# Patient Record
Sex: Male | Born: 1990 | Race: Black or African American | Hispanic: No | Marital: Single | Smoking: Never smoker
Health system: Southern US, Community
[De-identification: ages and names within clinical notes are randomized; demographics above are authoritative.]

## PROBLEM LIST (undated history)

## (undated) DIAGNOSIS — A6000 Herpesviral infection of urogenital system, unspecified: Secondary | ICD-10-CM

---

## 2017-05-29 ENCOUNTER — Encounter (HOSPITAL_COMMUNITY): Payer: Self-pay | Admitting: *Deleted

## 2017-05-29 ENCOUNTER — Ambulatory Visit (HOSPITAL_COMMUNITY)
Admission: EM | Admit: 2017-05-29 | Discharge: 2017-05-29 | Disposition: A | Discharge status: Home / Self Care | Payer: 59 | Attending: Internal Medicine | Admitting: Internal Medicine

## 2017-05-29 DIAGNOSIS — B078 Other viral warts: Secondary | ICD-10-CM | POA: Diagnosis not present

## 2017-05-29 DIAGNOSIS — R197 Diarrhea, unspecified: Secondary | ICD-10-CM | POA: Insufficient documentation

## 2017-05-29 DIAGNOSIS — R110 Nausea: Secondary | ICD-10-CM | POA: Insufficient documentation

## 2017-05-29 DIAGNOSIS — R1030 Lower abdominal pain, unspecified: Secondary | ICD-10-CM | POA: Diagnosis not present

## 2017-05-29 DIAGNOSIS — Z87438 Personal history of other diseases of male genital organs: Secondary | ICD-10-CM

## 2017-05-29 LAB — CBC WITH DIFFERENTIAL/PLATELET
Basophils Absolute: 0 10*3/uL (ref 0.0–0.1)
Basophils Relative: 0 %
Eosinophils Absolute: 0.1 10*3/uL (ref 0.0–0.7)
Eosinophils Relative: 2 %
HCT: 43.6 % (ref 39.0–52.0)
Hemoglobin: 14.9 g/dL (ref 13.0–17.0)
Lymphocytes Relative: 37 %
Lymphs Abs: 1.3 10*3/uL (ref 0.7–4.0)
MCH: 30.9 pg (ref 26.0–34.0)
MCHC: 34.2 g/dL (ref 30.0–36.0)
MCV: 90.5 fL (ref 78.0–100.0)
Monocytes Absolute: 0.2 10*3/uL (ref 0.1–1.0)
Monocytes Relative: 7 %
Neutro Abs: 1.8 10*3/uL (ref 1.7–7.7)
Neutrophils Relative %: 54 %
Platelets: 186 10*3/uL (ref 150–400)
RBC: 4.82 MIL/uL (ref 4.22–5.81)
RDW: 13.3 % (ref 11.5–15.5)
WBC: 3.4 10*3/uL — ABNORMAL LOW (ref 4.0–10.5)

## 2017-05-29 LAB — POCT I-STAT, CHEM 8
BUN: 12 mg/dL (ref 6–20)
CALCIUM ION: 1.11 mmol/L — AB (ref 1.15–1.40)
CHLORIDE: 102 mmol/L (ref 101–111)
Creatinine, Ser: 1.2 mg/dL (ref 0.61–1.24)
GLUCOSE: 109 mg/dL — AB (ref 65–99)
HCT: 47 % (ref 39.0–52.0)
Hemoglobin: 16 g/dL (ref 13.0–17.0)
POTASSIUM: 3.4 mmol/L — AB (ref 3.5–5.1)
Sodium: 141 mmol/L (ref 135–145)
TCO2: 25 mmol/L (ref 0–100)

## 2017-05-29 MED ORDER — VALACYCLOVIR HCL 1 G PO TABS: 1000.0000 mg | ORAL_TABLET | Freq: Three times a day (TID) | ORAL | 0 refills | Status: DC

## 2017-05-29 MED ORDER — SALICYLIC ACID 6 % EX GEL: Freq: Every day | CUTANEOUS | 0 refills | Status: DC

## 2017-05-29 MED ORDER — ONDANSETRON HCL 4 MG PO TABS: 4.0000 mg | ORAL_TABLET | Freq: Four times a day (QID) | ORAL | 0 refills | Status: DC

## 2017-05-29 NOTE — Discharge Instructions (Signed)
°  No Primary Care Doctor: °Call [facility] at  [phone] - they can help you locate a primary care doctor that  accepts your insurance, provides certain services, etc. °Physician Referral Service- [phone] ° ° ° °

## 2017-05-29 NOTE — ED Triage Notes (Signed)
Pt  Reports     Low   abd  Pain    Diarrhea      And  Cramps    With  Onset  Of   Symptoms  X  1   Week     Pt  Also  Has  Sore    On     Groin   Area   For  3  Days  That  Burns  And  Is  painfull

## 2017-05-29 NOTE — ED Provider Notes (Signed)
CSN: [id number]     Arrival date & time 05/29/17  1349 History   First MD Initiated Contact with Patient 05/29/17 1416     Chief Complaint  Patient presents with  . Abdominal Pain   (Consider location/radiation/quality/duration/timing/severity/associated sxs/prior Treatment) HPI  Jacob Ingram is a 26 y.o. male presenting to UC with c/o lower abdominal cramping with 1 week of intermittent watery diarrhea and loose stools.  No blood or mucous in stool.  He has also noticed a sore on the of proximal shaft of penis. Sore is itching and burning.  It has been there about 3 days.  It has drained a minimal amount. He would like to be tested for STDs including HIV and syphilis.   He also reports having a boil or bump on the top of his Right foot for about 1-2 weeks. He notes he has had similar lesions that appear at various spots on his body since he was a young boy. He denies pain on that lesion. He does not have a PCP at this time but would like a referral.    History reviewed. No pertinent past medical history. History reviewed. No pertinent surgical history. History reviewed. No pertinent family history. Social History  Substance Use Topics  . Smoking status: Never Smoker  . Smokeless tobacco: Never Used  . Alcohol use Yes    Review of Systems  Constitutional: Negative for chills and fever.  Gastrointestinal: Positive for abdominal pain (diffuse cramping), diarrhea and nausea. Negative for blood in stool and vomiting.  Genitourinary: Positive for genital sores. Negative for discharge, dysuria, frequency, penile pain, penile swelling, scrotal swelling and testicular pain.  Musculoskeletal: Negative for back pain and myalgias.  Skin: Positive for color change and wound.    Allergies  Patient has no known allergies.  Home Medications   Prior to Admission medications   Medication Sig Start Date End Date Taking? Authorizing Provider  ondansetron (ZOFRAN) 4 MG tablet Take 1 tablet (4 mg  total) by mouth every 6 (six) hours. 05/29/17   Lurene ShadowPhelps, Burnard Enis O, PA-C  salicylic acid 6 % gel Apply topically daily. To wart on Right foot for up to 12 weeks 05/29/17   Lurene ShadowPhelps, Judea Fennimore O, PA-C  valACYclovir (VALTREX) 1000 MG tablet Take 1 tablet (1,000 mg total) by mouth 3 (three) times daily. 05/29/17   Lurene ShadowPhelps, Kayla Weekes O, PA-C   Meds Ordered and Administered this Visit  Medications - No data to display  BP 116/72 (BP Location: Right Arm)   Pulse 66   Temp 98.7 F (37.1 C) (Oral)   Resp 18   SpO2 100%  No data found.   Physical Exam  Constitutional: He is oriented to person, place, and time. He appears well-developed and well-nourished. No distress.  HENT:  Head: Normocephalic and atraumatic.  Mouth/Throat: Oropharynx is clear and moist.  Eyes: EOM are normal.  Neck: Normal range of motion. Neck supple.  Cardiovascular: Normal rate and regular rhythm.   Pulmonary/Chest: Effort normal and breath sounds normal. No respiratory distress. He has no wheezes. He has no rales.  Abdominal: Soft. Bowel sounds are normal. He exhibits no distension and no mass. There is no tenderness. There is no rebound and no guarding. No hernia.  Genitourinary: Penis normal.     Genitourinary Comments: Two small scabbed over lesions at top of penis. Tender. No active drainage.   Musculoskeletal: Normal range of motion. He exhibits no edema or tenderness.  Neurological: He is alert and oriented to person, place, and  time.  Skin: Skin is warm and dry. He is not diaphoretic.  Right foot: 1cm circular flesh colored non-tender lesion on dorsal aspect.   Psychiatric: He has a normal mood and affect. His behavior is normal.  Nursing note and vitals reviewed.   Urgent Care Course     Procedures (including critical care time)  Labs Review Labs Reviewed  POCT I-STAT, CHEM 8 - Abnormal; Notable for the following:       Result Value   Potassium 3.4 (*)    Glucose, Bld 109 (*)    Calcium, Ion 1.11 (*)    All other  components within normal limits  HSV CULTURE AND TYPING  CBC WITH DIFFERENTIAL/PLATELET  RPR  HIV ANTIBODY (ROUTINE TESTING)  HSV 1 ANTIBODY, IGG  HSV 2 ANTIBODY, IGG  URINE CYTOLOGY ANCILLARY ONLY    Imaging Review No results found.    MDM   1. Nausea without vomiting   2. Diarrhea, unspecified type   3. History of penile sores   4. Other viral warts    Blood work for HIV, syphilis, and HSV I&2 sent to lab Urine for GC/chlamydia sent to lab Swab of penile lesion for HSV sent to lab Will start empiric treatment for genital herpes  Lesion on Right foot c/w wart. Rx: zofran, valtrex, and salicyclic acid gel for foot.  Home instructions provided F/u with PCP as needed.    Lurene Shadow, [place] 05/29/17 [phone]

## 2017-05-30 LAB — RPR: RPR Ser Ql: NONREACTIVE

## 2017-05-30 LAB — HIV ANTIBODY (ROUTINE TESTING W REFLEX): HIV Screen 4th Generation wRfx: NONREACTIVE

## 2017-05-31 LAB — HSV CULTURE AND TYPING

## 2017-05-31 LAB — URINE CYTOLOGY ANCILLARY ONLY
Chlamydia: NEGATIVE
Neisseria Gonorrhea: NEGATIVE

## 2019-06-04 ENCOUNTER — Ambulatory Visit (HOSPITAL_COMMUNITY)
Admission: EM | Admit: 2019-06-04 | Discharge: 2019-06-04 | Disposition: A | Discharge status: Home / Self Care | Payer: Commercial Managed Care - PPO | Attending: Family Medicine | Admitting: Family Medicine

## 2019-06-04 ENCOUNTER — Encounter (HOSPITAL_COMMUNITY): Payer: Self-pay | Admitting: Emergency Medicine

## 2019-06-04 DIAGNOSIS — Z113 Encounter for screening for infections with a predominantly sexual mode of transmission: Secondary | ICD-10-CM | POA: Diagnosis present

## 2019-06-04 DIAGNOSIS — R202 Paresthesia of skin: Secondary | ICD-10-CM | POA: Diagnosis not present

## 2019-06-04 DIAGNOSIS — Z8619 Personal history of other infectious and parasitic diseases: Secondary | ICD-10-CM

## 2019-06-04 DIAGNOSIS — R300 Dysuria: Secondary | ICD-10-CM | POA: Diagnosis not present

## 2019-06-04 MED ORDER — VALACYCLOVIR HCL 500 MG PO TABS: 500.0000 mg | ORAL_TABLET | Freq: Two times a day (BID) | ORAL | 0 refills | Status: AC

## 2019-06-04 NOTE — ED Triage Notes (Signed)
Pt states a year ago he was dx with herpes and sometimes he has off and on outbreaks. Pt staets sometimes he feels a tingle "down there". Requesting testing for stds.

## 2019-06-04 NOTE — Discharge Instructions (Signed)
Treating you for herpes flare. Sending your urine for further testing We will call you with any positive results.Follow up as needed for continued or worsening symptoms

## 2019-06-05 NOTE — ED Provider Notes (Signed)
[facility]    CSN: [id number] Arrival date & time: 06/04/19  1148      History   Chief Complaint Chief Complaint  Patient presents with  . Appointment    1210  . SEXUALLY TRANSMITTED DISEASE    HPI Karen KaysDamonte Kem is a 28 y.o. male.   Pt is a 28 year old male that presents today for STD screening. PMH of herpes. Feels a tingling sensation in the genital area. No active rash.  Mild dysuria.  No discharge.  Symptoms have been constant over the past couple days.  He is currently sexually active with one partner.  Reports uses protection.  He is concerned about herpes outbreak and would like treatment.  And he would also like to be tested for STDs.  Denies any abdominal pain, back pain, fevers, testicle pain or testicle swelling.  ROS per HPI      History reviewed. No pertinent past medical history.  There are no active problems to display for this patient.   History reviewed. No pertinent surgical history.     Home Medications    Prior to Admission medications   Medication Sig Start Date End Date Taking? Authorizing Provider  valACYclovir (VALTREX) 500 MG tablet Take 1 tablet (500 mg total) by mouth 2 (two) times daily for 3 days. 06/04/19 06/07/19  Janace ArisBast, Dalyn Kjos A, NP    Family History No family history on file.  Social History Social History   Tobacco Use  . Smoking status: Never Smoker  . Smokeless tobacco: Never Used  Substance Use Topics  . Alcohol use: Yes  . Drug use: Not on file     Allergies   Patient has no known allergies.   Review of Systems Review of Systems   Physical Exam Triage Vital Signs ED Triage Vitals [06/04/19 1208]  Enc Vitals Group     BP (!) 104/57     Pulse Rate 64     Resp 16     Temp 97.9 F (36.6 C)     Temp src      SpO2 100 %     Weight      Height      Head Circumference      Peak Flow      Pain Score 0     Pain Loc      Pain Edu?      Excl. in GC?    No data found.  Updated Vital Signs BP  (!) 104/57   Pulse 64   Temp 97.9 F (36.6 C)   Resp 16   SpO2 100%   Visual Acuity Right Eye Distance:   Left Eye Distance:   Bilateral Distance:    Right Eye Near:   Left Eye Near:    Bilateral Near:     Physical Exam Vitals signs and nursing note reviewed.  Constitutional:      Appearance: Normal appearance.  HENT:     Head: Normocephalic and atraumatic.     Nose: Nose normal.  Eyes:     Conjunctiva/sclera: Conjunctivae normal.  Neck:     Musculoskeletal: Normal range of motion.  Pulmonary:     Effort: Pulmonary effort is normal.  Abdominal:     Palpations: Abdomen is soft.     Tenderness: There is no abdominal tenderness.  Musculoskeletal: Normal range of motion.  Skin:    General: Skin is warm and dry.  Neurological:     Mental Status: He is alert.  Psychiatric:  Mood and Affect: Mood normal.      UC Treatments / Results  Labs (all labs ordered are listed, but only abnormal results are displayed) Labs Reviewed  [place]    EKG   Radiology No results found.  Procedures Procedures (including critical care time)  Medications Ordered in UC Medications - No data to display  Initial Impression / Assessment and Plan / UC Course  I have reviewed the triage vital signs and the nursing notes.  Pertinent labs & imaging results that were available during my care of the patient were reviewed by me and considered in my medical decision making (see chart for details).     Screening for STDs Treating for herpes flare Labs pending. Final Clinical Impressions(s) / UC Diagnoses   Final diagnoses:  Screen for STD (sexually transmitted disease)  Dysuria     Discharge Instructions     Treating you for herpes flare. Sending your urine for further testing We will call you with any positive results.Follow up as needed for continued or worsening symptoms    ED Prescriptions    Medication Sig Dispense Auth. Provider    valACYclovir (VALTREX) 500 MG tablet Take 1 tablet (500 mg total) by mouth 2 (two) times daily for 3 days. 6 tablet Loura Halt A, NP     Controlled Substance Prescriptions [place] Controlled Substance Registry consulted? Not Applicable   Orvan July, NP 06/05/19 [phone]

## 2019-06-06 LAB — URINE CYTOLOGY ANCILLARY ONLY
Chlamydia: NEGATIVE
Neisseria Gonorrhea: NEGATIVE
Trichomonas: NEGATIVE

## 2019-07-10 ENCOUNTER — Other Ambulatory Visit: Payer: Self-pay

## 2019-07-10 ENCOUNTER — Encounter (HOSPITAL_COMMUNITY): Payer: Self-pay | Admitting: Emergency Medicine

## 2019-07-10 ENCOUNTER — Ambulatory Visit (HOSPITAL_COMMUNITY)
Admission: EM | Admit: 2019-07-10 | Discharge: 2019-07-10 | Disposition: A | Discharge status: Home / Self Care | Payer: Commercial Managed Care - PPO | Attending: Family Medicine | Admitting: Family Medicine

## 2019-07-10 DIAGNOSIS — A6000 Herpesviral infection of urogenital system, unspecified: Secondary | ICD-10-CM

## 2019-07-10 HISTORY — DX: Herpesviral infection of urogenital system, unspecified: A60.00

## 2019-07-10 MED ORDER — VALACYCLOVIR HCL 1 G PO TABS: 1000.0000 mg | ORAL_TABLET | Freq: Two times a day (BID) | ORAL | 1 refills | Status: AC

## 2019-07-10 NOTE — Discharge Instructions (Signed)
At the first sign of an outbreak take 1 pill 2 times a day for 3 days I have given you 1 refill Recommend you follow-up with your primary care doctor for ongoing refills

## 2019-07-10 NOTE — ED Provider Notes (Signed)
[facility]    CSN: [id number] Arrival date & time: 07/10/19  1643      History   Chief Complaint Chief Complaint  Patient presents with  . Medication Refill    HPI Jacob Ingram is a 28 y.o. male.   HPI  Patient is here for recurrent genital herpes.  He is feels like he has an outbreak that is pending.  He starting to get some discomfort.  He would like a refill of his Valtrex.  He would actually like ongoing refills of his Valtrex.  I explained to him that this is an urgent care center and that we do not provide ongoing medical care.  I am happy to give him his medicine today with 1 refill, and a referral for a primary care office to see him on an ongoing basis  Past Medical History:  Diagnosis Date  . Genital herpes     There are no active problems to display for this patient.   History reviewed. No pertinent surgical history.     Home Medications    Prior to Admission medications   Medication Sig Start Date End Date Taking? Authorizing Provider  valACYclovir (VALTREX) 1000 MG tablet Take 1 tablet (1,000 mg total) by mouth 2 (two) times daily. 07/10/19   Raylene Everts, MD    Family History History reviewed. No pertinent family history.  Social History Social History   Tobacco Use  . Smoking status: Never Smoker  . Smokeless tobacco: Never Used  Substance Use Topics  . Alcohol use: Yes  . Drug use: Not on file     Allergies   Patient has no known allergies.   Review of Systems Review of Systems  Constitutional: Negative for chills and fever.  HENT: Negative for ear pain and sore throat.   Eyes: Negative for pain and visual disturbance.  Respiratory: Negative for cough and shortness of breath.   Cardiovascular: Negative for chest pain and palpitations.  Gastrointestinal: Negative for abdominal pain and vomiting.  Genitourinary: Negative for dysuria and hematuria.  Musculoskeletal: Negative for arthralgias and back pain.  Skin:  Positive for rash. Negative for color change.  Neurological: Negative for seizures and syncope.  All other systems reviewed and are negative.    Physical Exam Triage Vital Signs ED Triage Vitals  Enc Vitals Group     BP 07/10/19 1734 (!) 142/67     Pulse Rate 07/10/19 1734 64     Resp 07/10/19 1734 18     Temp 07/10/19 1734 98.3 F (36.8 C)     Temp Source 07/10/19 1734 Oral     SpO2 07/10/19 1734 99 %     Weight --      Height --      Head Circumference --      Peak Flow --      Pain Score 07/10/19 1735 2     Pain Loc --      Pain Edu? --      Excl. in [place]? --    No data found.  Updated Vital Signs BP (!) 142/67 (BP Location: Right Arm)   Pulse 64   Temp 98.3 F (36.8 C) (Oral)   Resp 18   SpO2 99%   Visual Acuity Right Eye Distance:   Left Eye Distance:   Bilateral Distance:    Right Eye Near:   Left Eye Near:    Bilateral Near:     Physical Exam Constitutional:      General:  He is not in acute distress.    Appearance: He is well-developed.  HENT:     Head: Normocephalic and atraumatic.  Eyes:     Conjunctiva/sclera: Conjunctivae normal.     Pupils: Pupils are equal, round, and reactive to light.  Neck:     Musculoskeletal: Normal range of motion.  Cardiovascular:     Rate and Rhythm: Normal rate.  Pulmonary:     Effort: Pulmonary effort is normal. No respiratory distress.  Abdominal:     General: There is no distension.     Palpations: Abdomen is soft.  Genitourinary:    Comments: Declined Musculoskeletal: Normal range of motion.  Skin:    General: Skin is warm and dry.  Neurological:     Mental Status: He is alert.      UC Treatments / Results  Labs (all labs ordered are listed, but only abnormal results are displayed) Labs Reviewed - No data to display  EKG   Radiology No results found.  Procedures Procedures (including critical care time)  Medications Ordered in UC Medications - No data to display  Initial Impression /  Assessment and Plan / UC Course  I have reviewed the triage vital signs and the nursing notes.  Pertinent labs & imaging results that were available during my care of the patient were reviewed by me and considered in my medical decision making (see chart for details).      Final Clinical Impressions(s) / UC Diagnoses   Final diagnoses:  Recurrent genital herpes     Discharge Instructions     At the first sign of an outbreak take 1 pill 2 times a day for 3 days I have given you 1 refill Recommend you follow-up with your primary care doctor for ongoing refills   ED Prescriptions    Medication Sig Dispense Auth. Provider   valACYclovir (VALTREX) 1000 MG tablet Take 1 tablet (1,000 mg total) by mouth 2 (two) times daily. 6 tablet Eustace MooreNelson, Annesha Delgreco Sue, MD     Controlled Substance Prescriptions [place] Controlled Substance Registry consulted? No   Eustace MooreNelson, Kerrick Miler Sue, MD 07/10/19 1807

## 2019-07-10 NOTE — ED Triage Notes (Signed)
Pt here for medication refill for herpes outbreak; denies wanting testing for stds at present

## 2020-06-07 ENCOUNTER — Ambulatory Visit (HOSPITAL_COMMUNITY)
Admission: EM | Admit: 2020-06-07 | Discharge: 2020-06-07 | Disposition: A | Discharge status: Home / Self Care | Payer: Commercial Managed Care - PPO | Attending: Physician Assistant | Admitting: Physician Assistant

## 2020-06-07 ENCOUNTER — Other Ambulatory Visit: Payer: Self-pay

## 2020-06-07 ENCOUNTER — Encounter (HOSPITAL_COMMUNITY): Payer: Self-pay

## 2020-06-07 DIAGNOSIS — N342 Other urethritis: Secondary | ICD-10-CM | POA: Insufficient documentation

## 2020-06-07 MED ORDER — DOXYCYCLINE HYCLATE 100 MG PO CAPS: 100.0000 mg | ORAL_CAPSULE | Freq: Two times a day (BID) | ORAL | 0 refills | Status: AC

## 2020-06-07 MED ORDER — CEFTRIAXONE SODIUM 500 MG IJ SOLR
500.0000 mg | Freq: Once | INTRAMUSCULAR | Status: AC
  Administered 2020-06-07: 17:00:00 500 mg via INTRAMUSCULAR

## 2020-06-07 MED ORDER — STERILE WATER FOR INJECTION IJ SOLN
INTRAMUSCULAR | Status: AC
  Filled 2020-06-07: qty 10

## 2020-06-07 MED ORDER — CEFTRIAXONE SODIUM 500 MG IJ SOLR
INTRAMUSCULAR | Status: AC
  Filled 2020-06-07: qty 500

## 2020-06-07 NOTE — Discharge Instructions (Signed)
Take the doxycycline 2 times a day for 7 days - drink 8 ounce of water with this - avoid long periods in direct sunlight when taking   Abstain from sex until all tests and treatments return  I recommended HIV and Syphillis testing, you should have this done at some point

## 2020-06-07 NOTE — ED Triage Notes (Signed)
Pt c/o dysuriax1wk. Pt wants tested for STIs.

## 2020-06-07 NOTE — ED Provider Notes (Signed)
[facility]    CSN: [id number] Arrival date & time: 06/07/20  1446      History   Chief Complaint Chief Complaint  Patient presents with  . dysuria    HPI Jacob Ingram is a 29 y.o. male.   Patient presents for painful urination for 1 week. No discharge.  Denies testicular pain or swelling.  No known exposures to STIs.  Hx of herpes, no current outbreak.  Patient declines HIV and syphilis testing.     Past Medical History:  Diagnosis Date  . Genital herpes     There are no problems to display for this patient.   History reviewed. No pertinent surgical history.     Home Medications    Prior to Admission medications   Medication Sig Start Date End Date Taking? Authorizing Provider  doxycycline (VIBRAMYCIN) 100 MG capsule Take 1 capsule (100 mg total) by mouth 2 (two) times daily for 7 days. 06/07/20 06/14/20  Alyzabeth Pontillo, Veryl Speak, PA-C  valACYclovir (VALTREX) 1000 MG tablet Take 1 tablet (1,000 mg total) by mouth 2 (two) times daily. 07/10/19   Eustace Moore, MD    Family History No family history on file.  Social History Social History   Tobacco Use  . Smoking status: Never Smoker  . Smokeless tobacco: Never Used  Substance Use Topics  . Alcohol use: Yes    Comment: occ  . Drug use: Never     Allergies   Patient has no known allergies.   Review of Systems Review of Systems   Physical Exam Triage Vital Signs ED Triage Vitals  Enc Vitals Group     BP 06/07/20 1547 121/72     Pulse Rate 06/07/20 1547 73     Resp 06/07/20 1547 16     Temp 06/07/20 1547 98.5 F (36.9 C)     Temp Source 06/07/20 1547 Oral     SpO2 06/07/20 1547 99 %     Weight 06/07/20 1549 (!) 330 lb (149.7 kg)     Height 06/07/20 1549 6\' 7"  (2.007 m)     Head Circumference --      Peak Flow --      Pain Score 06/07/20 1549 6     Pain Loc --      Pain Edu? --      Excl. in [place]? --    No data found.  Updated Vital Signs BP 121/72   Pulse 73   Temp 98.5 F  (36.9 C) (Oral)   Resp 16   Ht 6\' 7"  (2.007 m)   Wt (!) 330 lb (149.7 kg)   SpO2 99%   BMI 37.18 kg/m   Visual Acuity Right Eye Distance:   Left Eye Distance:   Bilateral Distance:    Right Eye Near:   Left Eye Near:    Bilateral Near:     Physical Exam Vitals and nursing note reviewed.  Genitourinary:    Comments: There are no genital skin lesions.  No inguinal lymphadenopathy.  No testicular swelling or tenderness  There is scant white discharge at the meatus.  Swab obtained.     UC Treatments / Results  Labs (all labs ordered are listed, but only abnormal results are displayed) Labs Reviewed  CYTOLOGY, (ORAL, ANAL, URETHRAL) ANCILLARY ONLY    EKG   Radiology No results found.  Procedures Procedures (including critical care time)  Medications Ordered in UC Medications  cefTRIAXone (ROCEPHIN) injection 500 mg (500 mg Intramuscular Given 06/07/20 1729)  Initial Impression / Assessment and Plan / UC Course  I have reviewed the triage vital signs and the nursing notes.  Pertinent labs & imaging results that were available during my care of the patient were reviewed by me and considered in my medical decision making (see chart for details).     #Urethritis Patient is a 29 year old presenting with urethritis.  Given discharge present on exam and history, will treat with Rocephin in clinic and doxycycline outpatient.  Swab sent.  Patient declined HIV and syphilis.  Safe sex precautions discussed.  Patient verbalized understanding plan of care. Final Clinical Impressions(s) / UC Diagnoses   Final diagnoses:  Urethritis     Discharge Instructions     Take the doxycycline 2 times a day for 7 days - drink 8 ounce of water with this - avoid long periods in direct sunlight when taking   Abstain from sex until all tests and treatments return  I recommended HIV and Syphillis testing, you should have this done at some point      ED Prescriptions     Medication Sig Dispense Auth. Provider   doxycycline (VIBRAMYCIN) 100 MG capsule Take 1 capsule (100 mg total) by mouth 2 (two) times daily for 7 days. 14 capsule Diana Armijo, Veryl Speak, PA-C     PDMP not reviewed this encounter.   Hermelinda Medicus, PA-C 06/07/20 1745

## 2020-06-08 LAB — CYTOLOGY, (ORAL, ANAL, URETHRAL) ANCILLARY ONLY
Chlamydia: POSITIVE — AB
Comment: NEGATIVE
Comment: NEGATIVE
Comment: NORMAL
Neisseria Gonorrhea: NEGATIVE
Trichomonas: NEGATIVE

## 2020-09-30 ENCOUNTER — Emergency Department (HOSPITAL_COMMUNITY): Payer: Self-pay

## 2020-09-30 ENCOUNTER — Encounter (HOSPITAL_COMMUNITY): Payer: Self-pay

## 2020-09-30 ENCOUNTER — Other Ambulatory Visit: Payer: Self-pay

## 2020-09-30 ENCOUNTER — Emergency Department (HOSPITAL_COMMUNITY)
Admission: EM | Admit: 2020-09-30 | Discharge: 2020-09-30 | Disposition: A | Discharge status: Home / Self Care | Payer: Self-pay | Attending: Emergency Medicine | Admitting: Emergency Medicine

## 2020-09-30 DIAGNOSIS — M7661 Achilles tendinitis, right leg: Secondary | ICD-10-CM | POA: Insufficient documentation

## 2020-09-30 NOTE — ED Provider Notes (Signed)
[facility] Provider Note   CSN: [id number] Arrival date & time: 09/30/20  2106     History Chief Complaint  Patient presents with  . Foot Pain    Jacob Ingram is a 29 y.o. male.  HPI Patient presents with right posterior foot/heel pain.  States around 1 year ago developed some pain.  Has had pain on and off particularly when he stands too long or uses too much of it.  States today he was helping his mother move and felt more pain.  It hurts to walk.  States the pain is always in the bottom of the Achilles on the right.  No swelling in the leg.  No other injury.  Has not seen anyone for this previously.    Past Medical History:  Diagnosis Date  . Genital herpes     There are no problems to display for this patient.   History reviewed. No pertinent surgical history.     No family history on file.  Social History   Tobacco Use  . Smoking status: Never Smoker  . Smokeless tobacco: Never Used  Substance Use Topics  . Alcohol use: Yes    Comment: occ  . Drug use: Never    Home Medications Prior to Admission medications   Medication Sig Start Date End Date Taking? Authorizing Provider  valACYclovir (VALTREX) 1000 MG tablet Take 1 tablet (1,000 mg total) by mouth 2 (two) times daily. 07/10/19   Eustace Moore, MD    Allergies    Other  Review of Systems   Review of Systems  Constitutional: Negative for appetite change.  Respiratory: Negative for shortness of breath.   Musculoskeletal: Positive for gait problem.       Right heel pain.  Skin: Negative for wound.  Neurological: Negative for weakness and numbness.  Psychiatric/Behavioral: Negative for confusion.    Physical Exam Updated Vital Signs BP 110/79 (BP Location: Left Arm)   Pulse 82   Temp 98.1 F (36.7 C) (Oral)   Resp 16   Ht 6\' 7"  (2.007 m)   Wt (!) 154.2 kg   SpO2 99%   BMI 38.30 kg/m   Physical Exam Vitals and nursing note reviewed.  HENT:      Head: Atraumatic.  Cardiovascular:     Rate and Rhythm: Regular rhythm.  Musculoskeletal:     Comments: Tenderness on right heel around the insertion of the Achilles.  No deformity.  No swelling.  No skin changes.  Still has good plantar flexion of the foot although has some pain.  Foot still warm without erythema or edema.  No tenderness upper calf.  Skin:    General: Skin is warm.     Capillary Refill: Capillary refill takes less than 2 seconds.  Neurological:     Mental Status: He is alert. Mental status is at baseline.     ED Results / Procedures / Treatments   Labs (all labs ordered are listed, but only abnormal results are displayed) Labs Reviewed - No data to display  EKG None  Radiology DG Ankle Complete Right  Result Date: 09/30/2020 CLINICAL DATA:  Right Achilles pain. Injured again today by moving furniture. EXAM: RIGHT ANKLE - COMPLETE 3+ VIEW COMPARISON:  None. FINDINGS: There is no evidence of fracture, dislocation, or joint effusion. Posterior calcaneal spur. There is no evidence of arthropathy or other focal bone abnormality. Soft tissues are unremarkable. IMPRESSION: No acute displaced fracture or dislocation. Electronically Signed   By: 13/02/2020  Tessie Fass M.D.   On: 09/30/2020 21:48    Procedures Procedures (including critical care time)  Medications Ordered in ED Medications - No data to display  ED Course  I have reviewed the triage vital signs and the nursing notes.  Pertinent labs & imaging results that were available during my care of the patient were reviewed by me and considered in my medical decision making (see chart for details).    MDM Rules/Calculators/A&P                          Patient with right heel/Achilles pain.  I think likely Achilles tendinitis but also potential Achilles tear.  Tenderness is mostly distal however.  Will give cam walker and have follow-up with orthopedic surgery.  X-ray reassuring.  X-ray reviewed by me. Final Clinical  Impression(s) / ED Diagnoses Final diagnoses:  Tendonitis, Achilles, right    Rx / DC Orders ED Discharge Orders    None       Benjiman Core, MD 09/30/20 2222

## 2020-09-30 NOTE — Progress Notes (Signed)
Orthopedic Tech Progress Note Patient Details:  Jacob Ingram 12-01-1990 [id number]  Ortho Devices Ortho Device/Splint Location: applied cam walker boot to  RLE Ortho Device/Splint Interventions: Ordered, Application   Post Interventions Patient Tolerated: Well Instructions Provided: Care of device   [name] 09/30/2020, 10:13 PM

## 2020-09-30 NOTE — ED Triage Notes (Signed)
Pt reports right foot pain in the achilles tendon area. Pt says he injured the area a year ago but had pains again today while moving furniture.

## 2020-11-12 ENCOUNTER — Other Ambulatory Visit: Payer: Self-pay

## 2020-11-12 ENCOUNTER — Encounter (HOSPITAL_COMMUNITY): Payer: Self-pay | Admitting: Emergency Medicine

## 2020-11-12 ENCOUNTER — Emergency Department (HOSPITAL_COMMUNITY): Payer: Self-pay

## 2020-11-12 ENCOUNTER — Emergency Department (HOSPITAL_COMMUNITY)
Admission: EM | Admit: 2020-11-12 | Discharge: 2020-11-13 | Disposition: A | Discharge status: Home / Self Care | Payer: Self-pay | Attending: Emergency Medicine | Admitting: Emergency Medicine

## 2020-11-12 DIAGNOSIS — R7989 Other specified abnormal findings of blood chemistry: Secondary | ICD-10-CM | POA: Insufficient documentation

## 2020-11-12 DIAGNOSIS — T5991XA Toxic effect of unspecified gases, fumes and vapors, accidental (unintentional), initial encounter: Secondary | ICD-10-CM | POA: Insufficient documentation

## 2020-11-12 DIAGNOSIS — U071 COVID-19: Secondary | ICD-10-CM | POA: Insufficient documentation

## 2020-11-12 LAB — PROTIME-INR
INR: 1.2 (ref 0.8–1.2)
Prothrombin Time: 14.8 seconds (ref 11.4–15.2)

## 2020-11-12 LAB — COMPREHENSIVE METABOLIC PANEL
ALT: 60 U/L — ABNORMAL HIGH (ref 0–44)
AST: 47 U/L — ABNORMAL HIGH (ref 15–41)
Albumin: 4.2 g/dL (ref 3.5–5.0)
Alkaline Phosphatase: 56 U/L (ref 38–126)
Anion gap: 9 (ref 5–15)
BUN: 13 mg/dL (ref 6–20)
CO2: 26 mmol/L (ref 22–32)
Calcium: 8.7 mg/dL — ABNORMAL LOW (ref 8.9–10.3)
Chloride: 105 mmol/L (ref 98–111)
Creatinine, Ser: 1.53 mg/dL — ABNORMAL HIGH (ref 0.61–1.24)
GFR, Estimated: >60 mL/min (ref >60)
Glucose, Bld: 86 mg/dL (ref 70–99)
Potassium: 3.7 mmol/L (ref 3.5–5.1)
Sodium: 140 mmol/L (ref 135–145)
Total Bilirubin: 0.3 mg/dL (ref 0.3–1.2)
Total Protein: 7.4 g/dL (ref 6.5–8.1)

## 2020-11-12 LAB — CBC WITH DIFFERENTIAL/PLATELET
Abs Immature Granulocytes: 0.02 10*3/uL (ref 0.00–0.07)
Basophils Absolute: 0 10*3/uL (ref 0.0–0.1)
Basophils Relative: 0 %
Eosinophils Absolute: 0 10*3/uL (ref 0.0–0.5)
Eosinophils Relative: 1 %
HCT: 42.1 % (ref 39.0–52.0)
Hemoglobin: 14.3 g/dL (ref 13.0–17.0)
Immature Granulocytes: 1 %
Lymphocytes Relative: 35 %
Lymphs Abs: 1.2 10*3/uL (ref 0.7–4.0)
MCH: 31.8 pg (ref 26.0–34.0)
MCHC: 34 g/dL (ref 30.0–36.0)
MCV: 93.8 fL (ref 80.0–100.0)
Monocytes Absolute: 0.6 10*3/uL (ref 0.1–1.0)
Monocytes Relative: 18 %
Neutro Abs: 1.5 10*3/uL — ABNORMAL LOW (ref 1.7–7.7)
Neutrophils Relative %: 45 %
Platelets: 159 10*3/uL (ref 150–400)
RBC: 4.49 MIL/uL (ref 4.22–5.81)
RDW: 14.1 % (ref 11.5–15.5)
WBC: 3.4 10*3/uL — ABNORMAL LOW (ref 4.0–10.5)
nRBC: 0 % (ref 0.0–0.2)

## 2020-11-12 LAB — LACTIC ACID, PLASMA: Lactic Acid, Venous: 1.6 mmol/L (ref 0.5–1.9)

## 2020-11-12 LAB — APTT: aPTT: 33 seconds (ref 24–36)

## 2020-11-12 MED ORDER — ACETAMINOPHEN 500 MG PO TABS
1000.0000 mg | ORAL_TABLET | Freq: Once | ORAL | Status: AC
  Administered 2020-11-13: 01:00:00 1000 mg via ORAL
  Filled 2020-11-12: qty 2

## 2020-11-12 NOTE — ED Triage Notes (Signed)
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 Reports that company called him and stated that he was inhaling coolant for 2 days. Dizziness, headaches, numbness in hands, and weakness.

## 2020-11-13 LAB — RESP PANEL BY RT-PCR (FLU A&B, COVID) ARPGX2
Influenza A by PCR: NEGATIVE
Influenza B by PCR: NEGATIVE
SARS Coronavirus 2 by RT PCR: POSITIVE — AB

## 2020-11-13 MED ORDER — BENZONATATE 100 MG PO CAPS: 100.0000 mg | ORAL_CAPSULE | Freq: Three times a day (TID) | ORAL | 0 refills | Status: AC

## 2020-11-13 MED ORDER — SODIUM CHLORIDE 0.9 % IV BOLUS
1000.0000 mL | Freq: Once | INTRAVENOUS | Status: AC
  Administered 2020-11-13: 01:00:00 1000 mL via INTRAVENOUS

## 2020-11-13 MED ORDER — ONDANSETRON 4 MG PO TBDP: 4.0000 mg | ORAL_TABLET | Freq: Three times a day (TID) | ORAL | 0 refills | Status: AC | PRN

## 2020-11-13 NOTE — ED Provider Notes (Signed)
[facility]-EMERGENCY DEPT Provider Note   CSN: [id number] Arrival date & time: 11/12/20  2118     History Chief Complaint  Patient presents with  . Toxic Inhalation    Jacob Ingram is a 29 y.o. male.  Patient presents to the emergency department with a chief complaint of generalized body aches.  He reports associated headache, dizziness, tingling in his hands, generalized weakness and fatigue.  He states that he has had some coughing.  He denies nausea or vomiting.  He is unvaccinated against Covid-19.  He is a [employer], and reports that there was a coolant leak in the truck, and he is concerned that he inhaled some of the coolant.  The history is provided by the patient. No language interpreter was used.       Past Medical History:  Diagnosis Date  . Genital herpes     There are no problems to display for this patient.   History reviewed. No pertinent surgical history.     No family history on file.  Social History   Tobacco Use  . Smoking status: Never Smoker  . Smokeless tobacco: Never Used  Substance Use Topics  . Alcohol use: Yes    Comment: occ  . Drug use: Never    Home Medications Prior to Admission medications   Medication Sig Start Date End Date Taking? Authorizing Provider  benzonatate (TESSALON) 100 MG capsule Take 1 capsule (100 mg total) by mouth every 8 (eight) hours. 11/13/20   Roxy Horseman, PA-C  ondansetron (ZOFRAN ODT) 4 MG disintegrating tablet Take 1 tablet (4 mg total) by mouth every 8 (eight) hours as needed for nausea or vomiting. 11/13/20   Roxy Horseman, PA-C  valACYclovir (VALTREX) 1000 MG tablet Take 1 tablet (1,000 mg total) by mouth 2 (two) times daily. 07/10/19   Eustace Moore, MD    Allergies    Other  Review of Systems   Review of Systems  All other systems reviewed and are negative.   Physical Exam Updated Vital Signs BP 132/66   Pulse 76   Temp 100.3 F (37.9 C) (Oral)    Resp (!) 21   SpO2 95%   Physical Exam Vitals and nursing note reviewed.  Constitutional:      Appearance: He is well-developed and well-nourished.  HENT:     Head: Normocephalic and atraumatic.  Eyes:     Conjunctiva/sclera: Conjunctivae normal.  Cardiovascular:     Rate and Rhythm: Normal rate and regular rhythm.     Heart sounds: No murmur heard.   Pulmonary:     Effort: Pulmonary effort is normal. No respiratory distress.     Breath sounds: Normal breath sounds.  Abdominal:     Palpations: Abdomen is soft.     Tenderness: There is no abdominal tenderness.  Musculoskeletal:        General: No edema. Normal range of motion.     Cervical back: Neck supple.  Skin:    General: Skin is warm and dry.  Neurological:     Mental Status: He is alert and oriented to person, place, and time.  Psychiatric:        Mood and Affect: Mood and affect and mood normal.        Behavior: Behavior normal.     ED Results / Procedures / Treatments   Labs (all labs ordered are listed, but only abnormal results are displayed) Labs Reviewed  RESP PANEL BY RT-PCR (FLU A&B, COVID) ARPGX2 -  Abnormal; Notable for the following components:      Result Value   SARS Coronavirus 2 by RT PCR POSITIVE (*)    All other components within normal limits  COMPREHENSIVE METABOLIC PANEL - Abnormal; Notable for the following components:   Creatinine, Ser 1.53 (*)    Calcium 8.7 (*)    AST 47 (*)    ALT 60 (*)    All other components within normal limits  CBC WITH DIFFERENTIAL/PLATELET - Abnormal; Notable for the following components:   WBC 3.4 (*)    Neutro Abs 1.5 (*)    All other components within normal limits  CULTURE, BLOOD (SINGLE)  URINE CULTURE  LACTIC ACID, PLASMA  PROTIME-INR  APTT  LACTIC ACID, PLASMA  URINALYSIS, ROUTINE W REFLEX MICROSCOPIC    EKG EKG Interpretation  Date/Time:  Friday November 12 2020 22:16:55 EST Ventricular Rate:  80 PR Interval:    QRS Duration: 78 QT  Interval:  348 QTC Calculation: 402 R Axis:   73 Text Interpretation: Sinus rhythm No old tracing to compare Confirmed by Drema Pry [phone]) on 11/13/2020 12:36:56 AM   Radiology DG Chest Port 1 View  Result Date: 11/12/2020 CLINICAL DATA:  Questionable sepsis - evaluate for abnormality EXAM: PORTABLE CHEST 1 VIEW COMPARISON:  None. FINDINGS: The cardiomediastinal contours are normal. The lungs are clear. Pulmonary vasculature is normal. No consolidation, pleural effusion, or pneumothorax. No acute osseous abnormalities are seen. IMPRESSION: Negative AP view of the chest. Electronically Signed   By: Narda Rutherford M.D.   On: 11/12/2020 22:31    Procedures Procedures (including critical care time)  Medications Ordered in ED Medications  acetaminophen (TYLENOL) tablet 1,000 mg (1,000 mg Oral Given 11/13/20 0037)  sodium chloride 0.9 % bolus 1,000 mL (1,000 mLs Intravenous New Bag/Given 11/13/20 0050)    ED Course  I have reviewed the triage vital signs and the nursing notes.  Pertinent labs & imaging results that were available during my care of the patient were reviewed by me and considered in my medical decision making (see chart for details).    MDM Rules/Calculators/A&P                         Patient here with generalized body aches.  He states that he noticed the symptoms while he was out driving truck.  He thought that it was from a coolant leak in the truck.  His symptoms have gradually worsened.  He is unvaccinated against Covid.  I am highly suspicious for Covid-19.  We will check Covid.  Will check basic labs and chest x-ray.  Will reassess.  Jacob Ingram was evaluated in Emergency Department on 11/13/2020 for the symptoms described in the history of present illness. He was evaluated in the context of the global COVID-19 pandemic, which necessitated consideration that the patient might be at risk for infection with the SARS-CoV-2 virus that causes COVID-19.  Institutional protocols and algorithms that pertain to the evaluation of patients at risk for COVID-19 are in a state of rapid change based on information released by regulatory bodies including the CDC and federal and state organizations. These policies and algorithms were followed during the patient's care in the ED.  Covid test is positive.  Creatinine is mildly increased from 3 years ago.  Doubt new acute kidney injury.  Will message [facility] for consideration for infusion.  He may qualify based on weight.  He is not hypoxic.  I do not believe that  he requires admission to the hospital.  Final Clinical Impression(s) / ED Diagnoses Final diagnoses:  COVID-19    Rx / DC Orders ED Discharge Orders         Ordered    benzonatate (TESSALON) 100 MG capsule  Every 8 hours        11/13/20 0153    ondansetron (ZOFRAN ODT) 4 MG disintegrating tablet  Every 8 hours PRN        11/13/20 0153           Roxy Horseman, PA-C 11/13/20 0159    Nira Conn, MD 11/15/20 1036

## 2020-11-13 NOTE — Discharge Instructions (Signed)
You will need to quarantine at home until 12/28.  You should return to the ER if your symptoms worsen.  Take Tylenol or Motrin for fever.

## 2020-11-14 ENCOUNTER — Telehealth: Payer: Self-pay | Admitting: Unknown Physician Specialty

## 2020-11-14 ENCOUNTER — Encounter: Payer: Self-pay | Admitting: Unknown Physician Specialty

## 2020-11-14 NOTE — Telephone Encounter (Signed)
I connected by phone with Jacob Ingram on 11/14/2020 at 1:19 PM to discuss the potential use of a new treatment for mild to moderate COVID-19 viral infection in non-hospitalized patients.  This patient is a 29 y.o. male that meets the FDA criteria for Emergency Use Authorization of COVID monoclonal antibody casirivimab/imdevimab, bamlanivimab/etesevimab, or sotrovimab.  Has a (+) direct SARS-CoV-2 viral test result  Has mild or moderate COVID-19   Is NOT hospitalized due to COVID-19  Is within 10 days of symptom onset  Has at least one of the high risk factor(s) for progression to severe COVID-19 and/or hospitalization as defined in EUA.  Specific high risk criteria : BMI > 25   I have spoken and communicated the following to the patient or parent/caregiver regarding COVID monoclonal antibody treatment:  1. FDA has authorized the emergency use for the treatment of mild to moderate COVID-19 in adults and pediatric patients with positive results of direct SARS-CoV-2 viral testing who are 69 years of age and older weighing at least 40 kg, and who are at high risk for progressing to severe COVID-19 and/or hospitalization.  2. The significant known and potential risks and benefits of COVID monoclonal antibody, and the extent to which such potential risks and benefits are unknown.  3. Information on available alternative treatments and the risks and benefits of those alternatives, including clinical trials.  4. Patients treated with COVID monoclonal antibody should continue to self-isolate and use infection control measures (e.g., wear mask, isolate, social distance, avoid sharing personal items, clean and disinfect "high touch" surfaces, and frequent handwashing) according to CDC guidelines.   5. The patient or parent/caregiver has the option to accept or refuse COVID monoclonal antibody treatment.  After reviewing this information with the patient, He would like to think about it,Jacob Tiberio, NP 11/14/2020 1:19 PM Sx onset 12/15

## 2022-02-15 IMAGING — DX DG CHEST 1V PORT
1 series · 1 of 1 positions shown · non-contrast
Comparison: None.

CLINICAL DATA: Questionable sepsis - evaluate for abnormality

EXAM:
PORTABLE CHEST 1 VIEW

[chest ap]
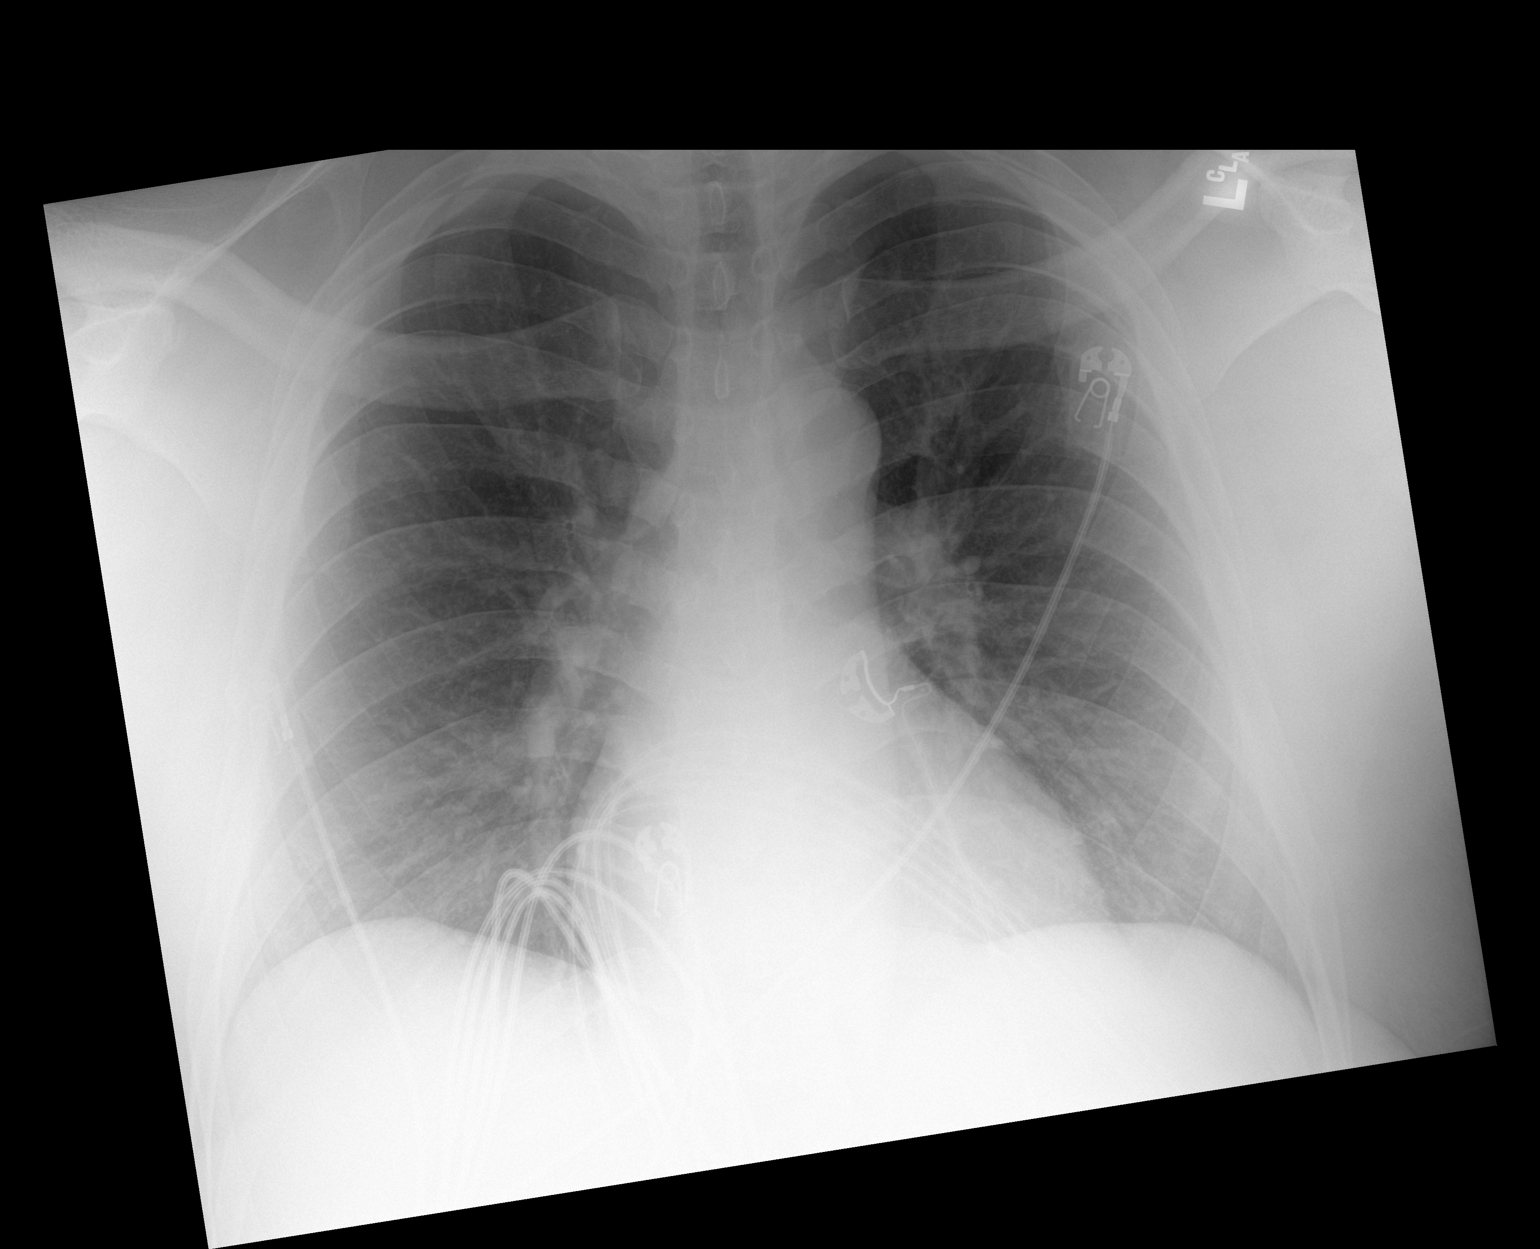

[1 of 1 positions shown; findings below may reference images not displayed]

FINDINGS: The cardiomediastinal contours are normal. The lungs are clear.
Pulmonary vasculature is normal. No consolidation, pleural effusion,
or pneumothorax. No acute osseous abnormalities are seen.
IMPRESSION: Negative AP view of the chest.
